# Patient Record
Sex: Male | Born: 1995 | Race: White | Hispanic: No | Marital: Single | Smoking: Light tobacco smoker
Health system: Southern US, Community
[De-identification: ages and names within clinical notes are randomized; demographics above are authoritative.]

## PROBLEM LIST (undated history)

## (undated) DIAGNOSIS — F32A Depression, unspecified: Secondary | ICD-10-CM

## (undated) DIAGNOSIS — F419 Anxiety disorder, unspecified: Secondary | ICD-10-CM

## (undated) DIAGNOSIS — F909 Attention-deficit hyperactivity disorder, unspecified type: Secondary | ICD-10-CM

## (undated) DIAGNOSIS — F329 Major depressive disorder, single episode, unspecified: Secondary | ICD-10-CM

---
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
## 2017-05-22 ENCOUNTER — Encounter (HOSPITAL_COMMUNITY): Payer: Self-pay

## 2017-05-22 DIAGNOSIS — Y9289 Other specified places as the place of occurrence of the external cause: Secondary | ICD-10-CM | POA: Insufficient documentation

## 2017-05-22 DIAGNOSIS — F909 Attention-deficit hyperactivity disorder, unspecified type: Secondary | ICD-10-CM | POA: Diagnosis not present

## 2017-05-22 DIAGNOSIS — Y9389 Activity, other specified: Secondary | ICD-10-CM | POA: Insufficient documentation

## 2017-05-22 DIAGNOSIS — T2046XA Corrosion of unspecified degree of forehead and cheek, initial encounter: Secondary | ICD-10-CM | POA: Diagnosis not present

## 2017-05-22 DIAGNOSIS — Y999 Unspecified external cause status: Secondary | ICD-10-CM | POA: Insufficient documentation

## 2017-05-22 DIAGNOSIS — T608X1A Toxic effect of other pesticides, accidental (unintentional), initial encounter: Secondary | ICD-10-CM | POA: Insufficient documentation

## 2017-05-22 DIAGNOSIS — X130XXA Inhalation of steam and other hot vapors, initial encounter: Secondary | ICD-10-CM | POA: Insufficient documentation

## 2017-05-22 DIAGNOSIS — F17200 Nicotine dependence, unspecified, uncomplicated: Secondary | ICD-10-CM | POA: Insufficient documentation

## 2017-05-22 NOTE — ED Triage Notes (Signed)
Pt here for exposure to insecticides, has rxn to face and eyes and reports cyzmic chemical is water resistant but pt tried to take shower. Reports facial swelling itching and shortness of breath. sts was exposed 130 to 330 this afternoon.

## 2017-05-23 ENCOUNTER — Emergency Department (HOSPITAL_COMMUNITY)
Admission: EM | Admit: 2017-05-23 | Discharge: 2017-05-23 | Disposition: A | Discharge status: Home / Self Care | Payer: BLUE CROSS/BLUE SHIELD | Attending: Emergency Medicine | Admitting: Emergency Medicine

## 2017-05-23 ENCOUNTER — Emergency Department (HOSPITAL_COMMUNITY): Payer: BLUE CROSS/BLUE SHIELD

## 2017-05-23 DIAGNOSIS — T304 Corrosion of unspecified body region, unspecified degree: Secondary | ICD-10-CM

## 2017-05-23 DIAGNOSIS — T6091XA Toxic effect of unspecified pesticide, accidental (unintentional), initial encounter: Secondary | ICD-10-CM

## 2017-05-23 MED ORDER — VITAMIN E 1000 UNITS EX CREA: TOPICAL_CREAM | CUTANEOUS | 0 refills | Status: AC

## 2017-05-23 MED ORDER — AQUAPHOR EX OINT: TOPICAL_OINTMENT | CUTANEOUS | 0 refills | Status: AC | PRN

## 2017-05-23 NOTE — ED Notes (Signed)
Pt departed in NAD, refused use of wheelchair.  

## 2017-05-23 NOTE — ED Provider Notes (Signed)
[facility]-EMERGENCY DEPT Provider Note   CSN: [id number] Arrival date & time: 05/22/17  2328   By signing my name below, I, Freida Busman, attest that this documentation has been prepared under the direction and in the presence of Pricilla Loveless, MD . Electronically Signed: Freida Busman, Scribe. 05/23/2017. 3:16 AM.   History   Chief Complaint Chief Complaint  Patient presents with  . Toxic Inhalation  . Allergic Reaction    The history is provided by the patient. No language interpreter was used.     HPI Comments:  Paul Shelton is a 21 y.o. male who presents to the Emergency Department complaining of a constant burning sensation to his face since accidentally spraying himself with cyzmic CM pesticide ~1500 yesterday while at work. He was not wearing protective eye goggles at the time; and notes he also inhaled the substance. He reports associated redness and pruritus to his eyes bilaterally, and blurry vision but the vision has improved. No current eye complaints. He also notes mild SOB, nausea, and cough all of which have improved at this time. He denies vomiting and urinary symptoms. Pt denies use of contact lenses. No alleviating factors noted.    Past Medical History:  Diagnosis Date  . ADHD (attention deficit hyperactivity disorder)   . Anxiety   . Depression     Patient Active Problem List   Diagnosis Date Noted  . ADHD (attention deficit hyperactivity disorder), combined type 11/03/2013  . MDD (major depressive disorder), single episode, severe , no psychosis (HCC) 11/03/2013    History reviewed. No pertinent surgical history.     Home Medications    Prior to Admission medications   Medication Sig Start Date End Date Taking? Authorizing Provider  escitalopram (LEXAPRO) 20 MG tablet Take 1 tablet (20 mg total) by mouth at bedtime. 11/08/13   Winson, Louie Bun, NP  Melatonin 10 MG CAPS Take 10 mg by mouth daily. Patient may resume home supply. 11/08/13   Winson, Louie Bun, NP  methylphenidate (CONCERTA) 54 MG CR tablet Take 1 tablet (54 mg total) by mouth daily. 11/08/13   Winson, Louie Bun, NP  mineral oil-hydrophilic petrolatum (AQUAPHOR) ointment Apply topically as needed for dry skin. 05/23/17   Pricilla Loveless, MD  Vitamin E 1000 units CREA Apply cream to the affected areas as need for relief of burning/itching 05/23/17   Pricilla Loveless, MD    Family History Family History  Problem Relation Age of Onset  . Diabetes Other   . CAD Other     Social History Social History  Substance Use Topics  . Smoking status: Light Tobacco Smoker  . Smokeless tobacco: Not on file  . Alcohol use No     Comment: Patient denies      Allergies   Patient has no known allergies.   Review of Systems Review of Systems  Eyes: Positive for redness, itching and visual disturbance.  Respiratory: Positive for cough (resolved) and shortness of breath.   Gastrointestinal: Positive for nausea (resolved). Negative for vomiting.  Genitourinary: Negative for difficulty urinating and dysuria.  All other systems reviewed and are negative.    Physical Exam Updated Vital Signs BP 135/66 (BP Location: Right Arm)   Pulse 80   Temp 97.8 F (36.6 C) (Oral)   Resp 16   Ht 5\' 8"  (1.727 m)   Wt 73.9 kg (163 lb)   SpO2 96%   BMI 24.78 kg/m   Physical Exam  Constitutional: He is oriented to person, place,  and time. He appears well-developed and well-nourished.  HENT:  Head: Normocephalic and atraumatic.  Right Ear: External ear normal.  Left Ear: External ear normal.  Nose: Nose normal.  Eyes: EOM are normal. Pupils are equal, round, and reactive to light. Right eye exhibits no discharge. Left eye exhibits no discharge. Right conjunctiva is not injected. Left conjunctiva is not injected.  Neck: Neck supple.  Cardiovascular: Normal rate, regular rhythm and normal heart sounds.   Pulmonary/Chest: Effort normal and breath sounds normal. No stridor. He has no wheezes.    Abdominal: Soft. There is no tenderness.  Musculoskeletal: He exhibits no edema.  Neurological: He is alert and oriented to person, place, and time.  Skin: Skin is warm and dry.  Bilateral periorbital redness without streaking  Nursing note and vitals reviewed.    ED Treatments / Results  DIAGNOSTIC STUDIES:  Oxygen Saturation is 96% on RA, normal by my interpretation.    COORDINATION OF CARE:  3:09 AM Discussed treatment plan with pt at bedside and pt agreed to plan.  Labs (all labs ordered are listed, but only abnormal results are displayed) Labs Reviewed - No data to display  EKG  EKG Interpretation None       Radiology Dg Chest 2 View  Result Date: 05/23/2017 CLINICAL DATA:  Accidentally inhaled insecticide. Burning sensation and shortness of breath. EXAM: CHEST  2 VIEW COMPARISON:  None. FINDINGS: The heart size and mediastinal contours are within normal limits. Both lungs are clear. The visualized skeletal structures are unremarkable. IMPRESSION: Normal chest. Electronically Signed   By: Awilda Metroourtnay  Bloomer M.D.   On: 05/23/2017 01:53    Procedures Procedures (including critical care time)  Medications Ordered in ED Medications - No data to display   Initial Impression / Assessment and Plan / ED Course  I have reviewed the triage vital signs and the nursing notes.  Pertinent labs & imaging results that were available during my care of the patient were reviewed by me and considered in my medical decision making (see chart for details).     Patient is now about 12 hours out from the exposure. Only current symptoms are redness/burning to face. No current eye complaints. Given earlier eye complaints, offered eye exam including fluorescein staining but he declines. No wheezing. D/w poison control, symptomatic treatment includes vitamin E cream and/or aquaphor. He has already showered twice. D/c home with return precautions.  Final Clinical Impressions(s) / ED  Diagnoses   Final diagnoses:  Insecticide poisoning, accidental or unintentional, initial encounter  Chemical burn of skin    New Prescriptions Discharge Medication List as of 05/23/2017  3:49 AM    START taking these medications   Details  mineral oil-hydrophilic petrolatum (AQUAPHOR) ointment Apply topically as needed for dry skin., Starting Tue 05/23/2017, Print    Vitamin E 1000 units CREA Apply cream to the affected areas as need for relief of burning/itching, Print       I personally performed the services described in this documentation, which was scribed in my presence. The recorded information has been reviewed and is accurate.     Pricilla LovelessGoldston, Xoey Warmoth, MD 05/23/17 1018

## 2017-05-23 NOTE — ED Notes (Signed)
Patient up to desk stating he has to go to work in the am, and was wondering if he should just leave and come back tomorrow.  This RN encouraged patient to stay for xray and to follow up.  Encouraged patient with offer of work note after being seen by MD.  Patient agreed.

## 2017-12-01 IMAGING — DX DG CHEST 2V
2 series · 2 of 2 positions shown · non-contrast
Comparison: None.

CLINICAL DATA: Accidentally inhaled insecticide. Burning sensation
and shortness of breath.

EXAM:
CHEST  2 VIEW

[chest pa]
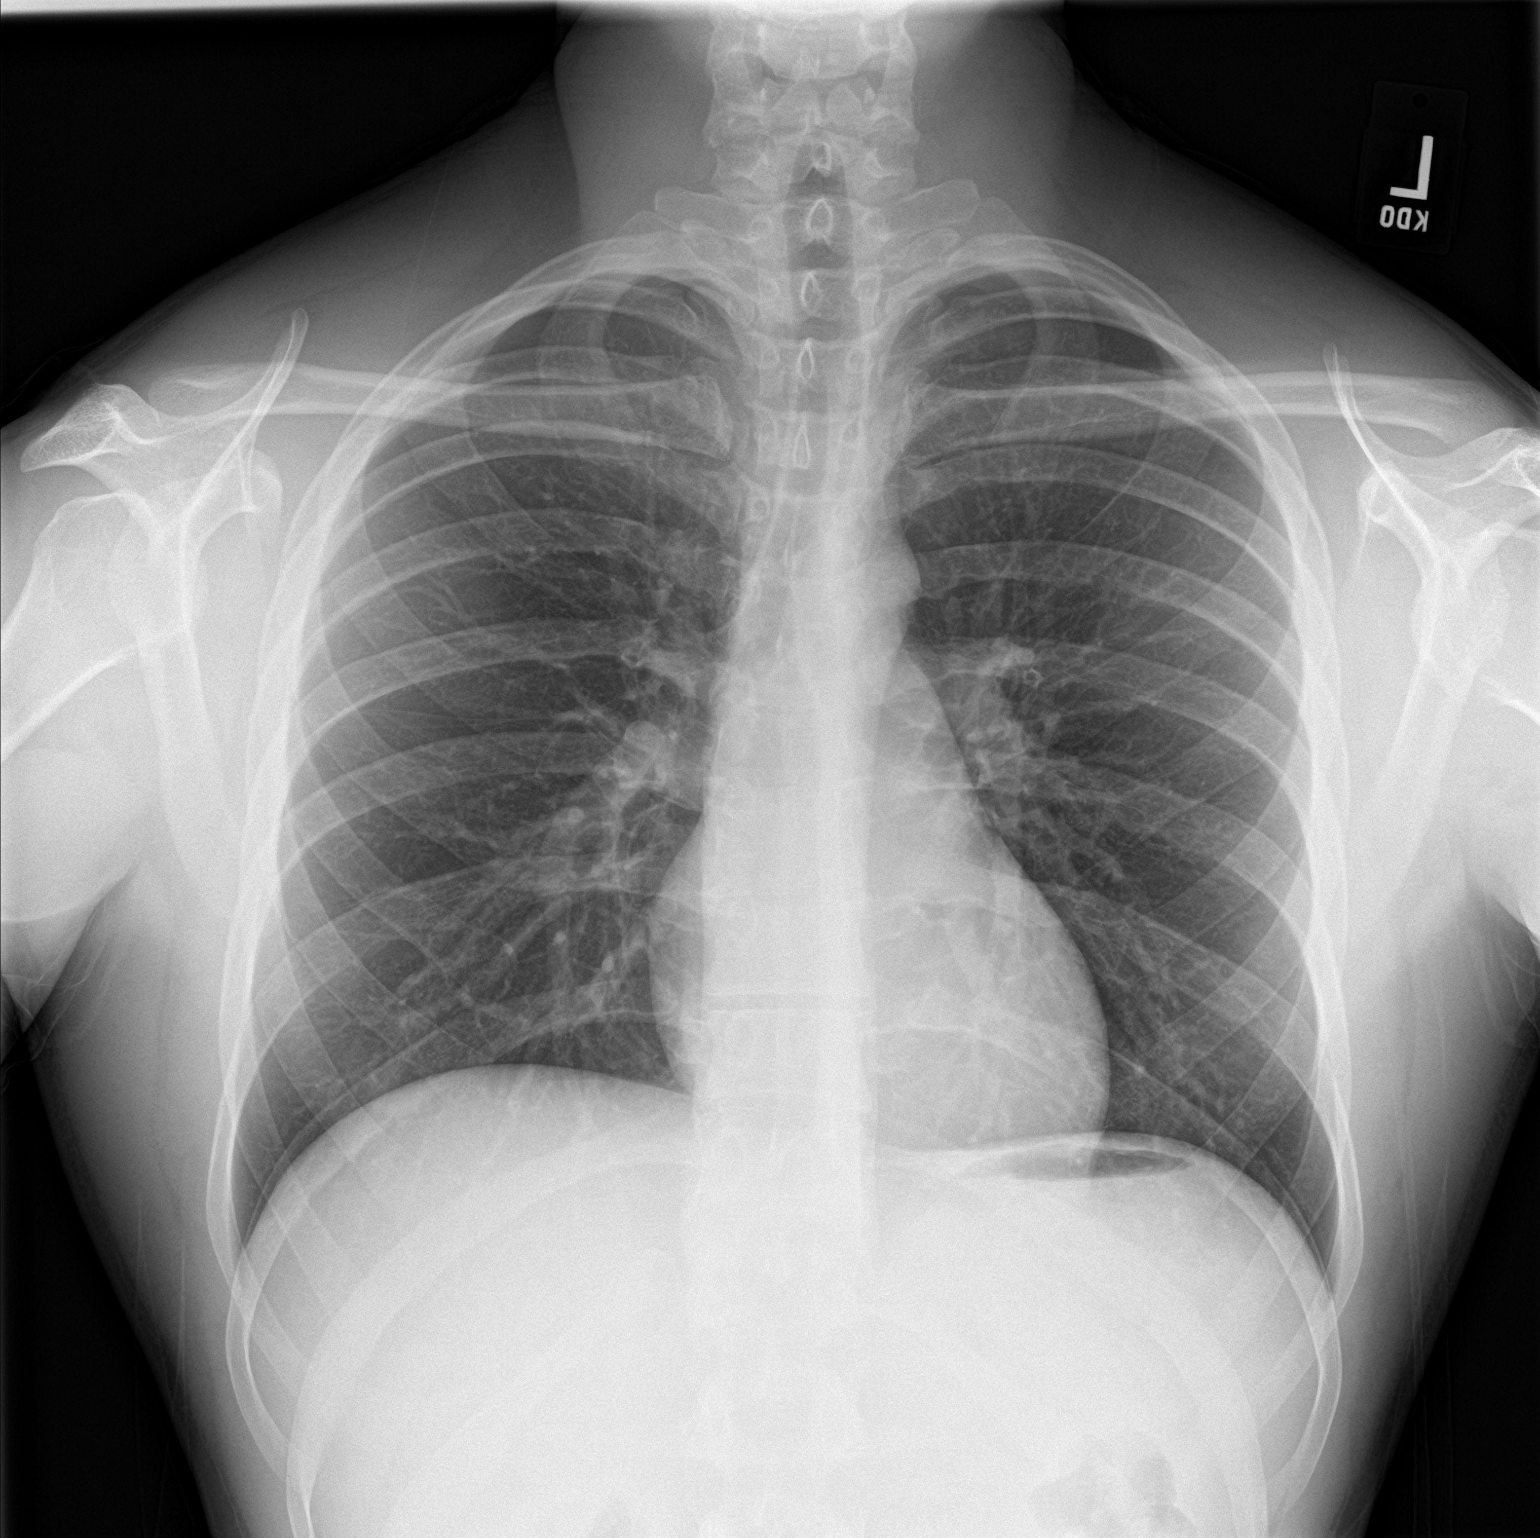

[chest lat]
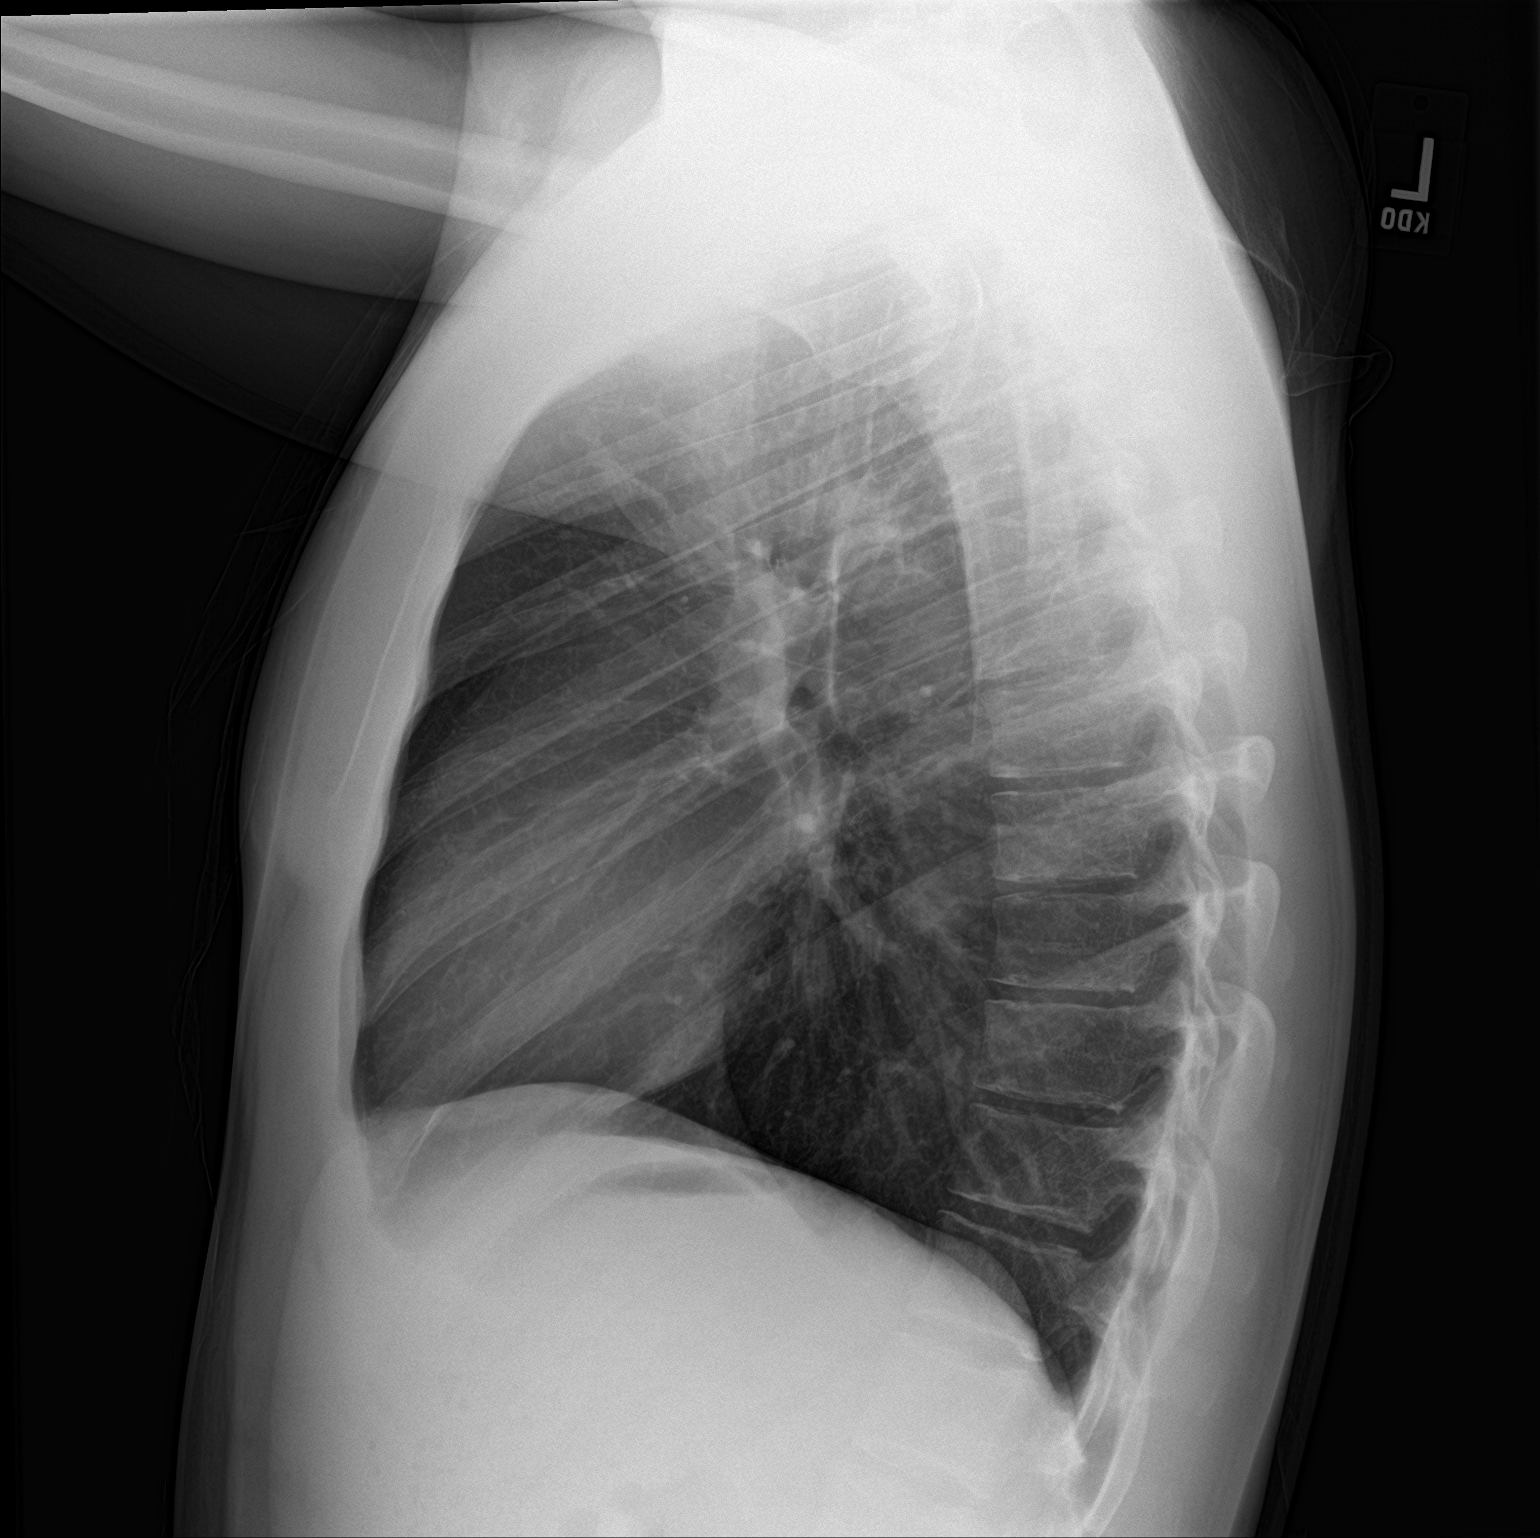

[2 of 2 positions shown; findings below may reference images not displayed]

FINDINGS: The heart size and mediastinal contours are within normal limits.
Both lungs are clear. The visualized skeletal structures are
unremarkable.
IMPRESSION: Normal chest.
